# Patient Record
Sex: Male | Born: 2000 | Race: Black or African American | Hispanic: No | Marital: Single | Smoking: Never smoker
Health system: Southern US, Community
[De-identification: ages and names within clinical notes are randomized; demographics above are authoritative.]

## PROBLEM LIST (undated history)

## (undated) DIAGNOSIS — L910 Hypertrophic scar: Secondary | ICD-10-CM

## (undated) HISTORY — PX: BRAIN TUMOR EXCISION: SHX577

## (undated) HISTORY — DX: Hypertrophic scar: L91.0

## (undated) HISTORY — PX: CRANIOTOMY: SHX93

## (undated) HISTORY — PX: CIRCUMCISION: SHX1350

---
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
## 2020-01-19 ENCOUNTER — Other Ambulatory Visit: Payer: Self-pay

## 2020-01-19 ENCOUNTER — Ambulatory Visit (INDEPENDENT_AMBULATORY_CARE_PROVIDER_SITE_OTHER): Payer: 59 | Admitting: Neurology

## 2020-01-19 ENCOUNTER — Encounter (INDEPENDENT_AMBULATORY_CARE_PROVIDER_SITE_OTHER): Payer: Self-pay | Admitting: Neurology

## 2020-01-19 VITALS — BP 118/78 | HR 78 | Ht 68.9 in | Wt 222.4 lb

## 2020-01-19 DIAGNOSIS — G40909 Epilepsy, unspecified, not intractable, without status epilepticus: Secondary | ICD-10-CM | POA: Diagnosis not present

## 2020-01-19 DIAGNOSIS — Z86011 Personal history of benign neoplasm of the brain: Secondary | ICD-10-CM | POA: Diagnosis not present

## 2020-01-19 MED ORDER — LEVETIRACETAM ER 500 MG PO TB24: 1000.0000 mg | ORAL_TABLET | Freq: Every day | ORAL | 6 refills | Status: DC

## 2020-01-19 NOTE — Progress Notes (Signed)
Patient: Dave Jackson MRN: [id number] Sex: male DOB: 06/22/2001  Provider: Teressa Lower, MD Location of Care: [facility]  Note type: Routine return visit  Referral Source: Johny Drilling, MD History from: patient, [facility] chart and mom Chief Complaint: seizures  History of Present Illness: Dave Jackson is a 19 y.o. male is here for follow-up management of seizure disorder.  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 He has been on Keppra with good seizure control and no more clinical seizure activity over the past few years and with the last EEG in January 2021 with normal result. He has been on lower dose of medication since his last visit in January 2021 which is 1000 mg daily, tolerating well with no side effects and no more seizure activity. He has been seen and followed by neurosurgery at [facility] and is going to have a follow-up MRI in September and if that is normal then they will discharge him from their practice. He has no other issues with no headache, no sleep difficulty and no behavioral or mood issues and doing well otherwise.  Review of Systems: Review of system as per HPI, otherwise negative.  Past Medical History:  Diagnosis Date  . Keloid    left parietotemporal area s/p craniotomy    Hospitalizations: No., Head Injury: No., Nervous System Infections: No., Immunizations up to date: Yes.     Surgical History Past Surgical History:  Procedure Laterality Date  . BRAIN TUMOR EXCISION     bx 07/13/02/pontine lesion/s/p 2 resctions consistent w/ pilocystic astyrocytoma  . CIRCUMCISION    . CRANIOTOMY  01/14/03 & 01/14/07   years ago  at [facility]. hospital, benign tumor/plastic reconstruction w/ dd flaps from the scalp  . KELOID EXCISION  2008   removal    Family  History family history includes Cancer in his maternal grandmother; Diabetes in his paternal grandfather; Heart failure in his paternal grandmother.   Social History Social History   Socioeconomic History  . Marital status: Single    Spouse name: Not on file  . Number of children: Not on file  . Years of education: Not on file  . Highest education level: Not on file  Occupational History  . Occupation: Ship broker    Comment: [employer]  Tobacco Use  . Smoking status: Never Smoker  . Smokeless tobacco: Never Used  Substance and Sexual Activity  . Alcohol use: No  . Drug use: No  . Sexual activity: Never  Other Topics Concern  . Not on file  Social History Narrative   [name] attends [employer]. He does well in school. He enjoys playing basketball.    Lives with is parents and younger brother.   Social Determinants of Health   Financial Resource Strain:   . Difficulty of Paying Living Expenses:   Food Insecurity:   . Worried About Charity fundraiser in the Last Year:   . Arboriculturist in the Last Year:   Transportation Needs:   . Film/video editor (Medical):   Marland Kitchen Lack of Transportation (Non-Medical):   Physical Activity:   . Days of Exercise per Week:   . Minutes of Exercise per Session:   Stress:   . Feeling of Stress :   Social Connections:   . Frequency of Communication with Friends  and Family:   . Frequency of Social Gatherings with Friends and Family:   . Attends Religious Services:   . Active Member of Clubs or Organizations:   . Attends Archivist Meetings:   Marland Kitchen Marital Status:      No Known Allergies  Physical Exam BP 118/78   Pulse 78   Ht 5' 8.9" (1.75 m)   Wt 222 lb 7.1 oz (100.9 kg)   BMI 32.95 kg/m  Gen: Awake, alert, not in distress Skin: No rash, No neurocutaneous stigmata. HEENT: Normocephalic, no dysmorphic features, no conjunctival injection, nares patent, mucous membranes moist, oropharynx clear. Neck:  Supple, no meningismus. No focal tenderness. Resp: Clear to auscultation bilaterally CV: Regular rate, normal S1/S2, no murmurs, no rubs Abd: BS present, abdomen soft, non-tender, non-distended. No hepatosplenomegaly or mass Ext: Warm and well-perfused. No deformities, no muscle wasting, ROM full.  Neurological Examination: MS: Awake, alert, interactive. Normal eye contact, answered the questions appropriately, speech was fluent,  Normal comprehension.  Attention and concentration were normal. Cranial Nerves: Pupils were equal and reactive to light ( 5-76mm);  normal fundoscopic exam with sharp discs, visual field full with confrontation test; EOM normal, no nystagmus; no ptsosis, no double vision, intact facial sensation, face symmetric with full strength of facial muscles, hearing intact to finger rub bilaterally, palate elevation is symmetric, tongue protrusion is symmetric with full movement to both sides.  Sternocleidomastoid and trapezius are with normal strength. Tone-Normal Strength-Normal strength in all muscle groups DTRs-  Biceps Triceps Brachioradialis Patellar Ankle  R 2+ 2+ 2+ 2+ 2+  L 2+ 2+ 2+ 2+ 2+   Plantar responses flexor bilaterally, no clonus noted Sensation: Intact to light touch,  Romberg negative. Coordination: No dysmetria on FTN test. No difficulty with balance. Gait: Normal walk and run. Tandem gait was normal. Was able to perform toe walking and heel walking without difficulty.   Assessment and Plan 1. Seizure disorder ([place])   2. Personal history of benign brain tumor    This is an 19 year old male with diagnosis of pilocytic astrocytoma in pontine area in 2004 and seizure disorder since 2016, currently on fairly low-dose of Keppra at 1000 mg daily with no seizure activity for the past few years.  His last EEG was normal. Recommendations: Continue the same dose of Keppra at 1000 mg daily for now He will have the follow-up brain MRI in September Mother will  call after the MRI and if the result is normal then I will taper the Keppra to 500 mg for 4 to 6 weeks and then stop the medication and right after that I will perform a sleep deprived EEG which would be toward the end of October to evaluate for any epileptiform discharges off of medication. If his EEG is normal then she would be discharged from our practice but if there is any abnormality then we will restart him on seizure medication.  He and his mother understood and agreed with the plan.  Meds ordered this encounter  Medications  . levETIRAcetam (KEPPRA XR) 500 MG 24 hr tablet    Sig: Take 2 tablets (1,000 mg total) by mouth daily.    Dispense:  60 tablet    Refill:  6   Orders Placed This Encounter  Procedures  . Child sleep deprived EEG    Standing Status:   Future    Standing Expiration Date:   01/18/2021

## 2020-01-19 NOTE — Patient Instructions (Addendum)
Continue Keppra at the same dose of 1000 mg twice daily After the brain MRI in September, call the office to discuss decreasing the dose of Keppra if the MRI is normal We will schedule for a follow-up EEG at the end of October. I will see you at the same time with the EEG.

## 2020-03-24 NOTE — Progress Notes (Signed)
error 

## 2020-04-21 ENCOUNTER — Ambulatory Visit (INDEPENDENT_AMBULATORY_CARE_PROVIDER_SITE_OTHER): Payer: 59 | Admitting: Neurology

## 2020-04-28 ENCOUNTER — Ambulatory Visit (INDEPENDENT_AMBULATORY_CARE_PROVIDER_SITE_OTHER): Payer: 59 | Admitting: Neurology

## 2020-04-28 ENCOUNTER — Other Ambulatory Visit: Payer: Self-pay

## 2020-04-28 ENCOUNTER — Encounter (INDEPENDENT_AMBULATORY_CARE_PROVIDER_SITE_OTHER): Payer: Self-pay | Admitting: Neurology

## 2020-04-28 VITALS — BP 118/78 | HR 80 | Ht 69.09 in | Wt 223.5 lb

## 2020-04-28 DIAGNOSIS — G40909 Epilepsy, unspecified, not intractable, without status epilepticus: Secondary | ICD-10-CM

## 2020-04-28 DIAGNOSIS — R569 Unspecified convulsions: Secondary | ICD-10-CM

## 2020-04-28 DIAGNOSIS — Z86011 Personal history of benign neoplasm of the brain: Secondary | ICD-10-CM | POA: Diagnosis not present

## 2020-04-28 MED ORDER — LEVETIRACETAM ER 500 MG PO TB24: 500.0000 mg | ORAL_TABLET | Freq: Every day | ORAL | 1 refills | Status: DC

## 2020-04-28 NOTE — Progress Notes (Signed)
Patient: Dave Jackson MRN: [id number] Sex: male DOB: [birth year]  Provider: Teressa Lower, MD Location of Care: [facility]  Note type: Routine return visit  Referral Source: Johny Drilling, MD History from: patient, [facility] chart and mom Chief Complaint: Seizure, EEG Results  History of Present Illness: Dave Jackson is a 19 y.o. male is here for follow-up management of seizure disorder and discussing the EEG result.  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 He was last seen in July 2021 and he was recommended to continue with lower dose of Keppra at 1000 mg daily until he would have his follow-up MRI at [facility] and then depends on the results, we will do another EEG and then decide regarding discontinuing seizure medication. His MRI was done last month in September with stable findings compared to the previous MRI so he was released from neurosurgery care without any more follow-up visit. As mentioned over the past several months he has not had any other issues and has been doing well with no more seizure activity and without any balance issues or visual changes. He underwent an EEG prior to this visit which did not show any epileptiform discharges or seizure activity.  Review of Systems: Review of system as per HPI, otherwise negative.  Past Medical History:  Diagnosis Date  . Keloid    left parietotemporal area s/p craniotomy    Hospitalizations: No., Head Injury: No., Nervous System Infections: No., Immunizations up to date: Yes.     Surgical History Past Surgical History:  Procedure  Laterality Date  . BRAIN TUMOR EXCISION     bx 07/13/02/pontine lesion/s/p 2 resctions consistent w/ pilocystic astyrocytoma  . CIRCUMCISION    . CRANIOTOMY  01/14/03 & 01/14/07   years ago  at [facility]. hospital, benign tumor/plastic reconstruction w/ dd flaps from the scalp  . KELOID EXCISION  2008   removal    Family History family history includes Cancer in his maternal grandmother; Diabetes in his paternal grandfather; Heart failure in his paternal grandmother.   Social History Social History   Socioeconomic History  . Marital status: Single    Spouse name: Not on file  . Number of children: Not on file  . Years of education: Not on file  . Highest education level: Not on file  Occupational History  . Occupation: Ship broker    Comment: [employer]  Tobacco Use  . Smoking status: Never Smoker  . Smokeless tobacco: Never Used  Substance and Sexual Activity  . Alcohol use: No  . Drug use: No  . Sexual activity: Never  Other Topics Concern  . Not on file  Social History Narrative   Dave Jackson attends [employer]. He does well in school. He enjoys playing basketball.    Lives with is parents and younger brother.   Social Determinants of Health   Financial Resource Strain:   . Difficulty of Paying Living Expenses: Not on file  Food Insecurity:   . Worried About Charity fundraiser in the Last Year: Not on file  . Ran Out of Food in the Last  Year: Not on file  Transportation Needs:   . Lack of Transportation (Medical): Not on file  . Lack of Transportation (Non-Medical): Not on file  Physical Activity:   . Days of Exercise per Week: Not on file  . Minutes of Exercise per Session: Not on file  Stress:   . Feeling of Stress : Not on file  Social Connections:   . Frequency of Communication with Friends and Family: Not on file  . Frequency of Social Gatherings with Friends and Family: Not on file  . Attends Religious Services: Not on file  . Active Member of  Clubs or Organizations: Not on file  . Attends Archivist Meetings: Not on file  . Marital Status: Not on file     No Known Allergies  Physical Exam BP 118/78   Pulse 80   Ht 5' 9.09" (1.755 m)   Wt 223 lb 8.7 oz (101.4 kg)   BMI 32.92 kg/m  Gen: Awake, alert, not in distress Skin: No rash, No neurocutaneous stigmata. HEENT: Normocephalic, no dysmorphic features, no conjunctival injection, nares patent, mucous membranes moist, oropharynx clear. Neck: Supple, no meningismus. No focal tenderness. Resp: Clear to auscultation bilaterally CV: Regular rate, normal S1/S2, no murmurs, no rubs Abd: BS present, abdomen soft, non-tender, non-distended. No hepatosplenomegaly or mass Ext: Warm and well-perfused. No deformities, no muscle wasting, ROM full.  Neurological Examination: MS: Awake, alert, interactive. Normal eye contact, answered the questions appropriately, speech was fluent,  Normal comprehension.  Attention and concentration were normal. Cranial Nerves: Pupils were equal and reactive to light ( 5-83mm);  normal fundoscopic exam with sharp discs, visual field full with confrontation test; EOM normal, no nystagmus; no ptsosis, no double vision, intact facial sensation, face symmetric with full strength of facial muscles, hearing intact to finger rub bilaterally, palate elevation is symmetric, tongue protrusion is symmetric with full movement to both sides.  Sternocleidomastoid and trapezius are with normal strength. Tone-Normal Strength-Normal strength in all muscle groups DTRs-  Biceps Triceps Brachioradialis Patellar Ankle  R 2+ 2+ 2+ 2+ 2+  L 2+ 2+ 2+ 2+ 2+   Plantar responses flexor bilaterally, no clonus noted Sensation: Intact to light touch,  Romberg negative. Coordination: No dysmetria on FTN test. No difficulty with balance. Gait: Normal walk and run. Tandem gait was normal. Was able to perform toe walking and heel walking without difficulty.   Assessment and  Plan 1. Seizure disorder ([place])   2. Personal history of benign brain tumor    This is a 53 and half-year-old male with history of pilocytic astrocytoma in 2004 status post repair and seizure since 2016 on Keppra with no more seizure activity.  His EEG prior to this visit was normal as well as the previous EEG in January 2021 and he has not had any clinical seizure activity for the past few years.  His follow-up brain MRI last month was stable. Discussed with patient and his mother that since he has been doing well without any clinical seizure activity and with 2 normal EEGs in 2021, I think he could taper and discontinue seizure medication without any issues although there is always a slight increase chance of seizure activity during tapering of medication. I would recommend to decrease the dose of Keppra to 500 mg nightly for the next 2 months and then discontinue medication. If he develops any clinical episodes concerning for seizure activity, he will call my office and let me know. He will continue with adequate sleep and  limited screen time to prevent from more seizure activity I think he may benefit from follow-up with an ophthalmology for regular eye exam and also check with funduscopy and peripheral vision. He will continue follow-up with his primary care physician but I will be available for any question concerns.  He and his mother understood and agreed with the plan.  Meds ordered this encounter  Medications  . levETIRAcetam (KEPPRA XR) 500 MG 24 hr tablet    Sig: Take 1 tablet (500 mg total) by mouth at bedtime.    Dispense:  30 tablet    Refill:  1

## 2020-04-28 NOTE — Procedures (Signed)
Patient:  Dave Jackson   Sex: male  DOB:  2000/09/06  Date of study: 04/28/2020                  Clinical history: This is a 19 year old male with remote history of pilocytic astrocytoma status post surgery, history of seizure disorder, currently on medication.  This is a follow-up EEG for evaluation of epileptiform discharges.  Medication: Keppra             Procedure: The tracing was carried out on a 32 channel digital Cadwell recorder reformatted into 16 channel montages with 1 devoted to EKG.  The 10 /20 international system electrode placement was used. Recording was done during awake state. Recording time 34 minutes.   Description of findings: Background rhythm consists of amplitude of   30 microvolt and frequency of 9-10 hertz posterior dominant rhythm. There was normal anterior posterior gradient noted. Background was well organized, continuous and symmetric with no focal slowing. There were occasional muscle artifact as well has pulse artifacts noted. Hyperventilation resulted in slowing of the background activity. Photic stimulation using stepwise increase in photic frequency resulted in bilateral symmetric driving response. Throughout the recording there were no focal or generalized epileptiform activities in the form of spikes or sharps noted. There were no transient rhythmic activities or electrographic seizures noted. One lead EKG rhythm strip revealed sinus rhythm at a rate of 75 bpm.  Impression: This EEG is normal during awake state. Please note that normal EEG does not exclude epilepsy, clinical correlation is indicated.      Teressa Lower, MD

## 2020-04-28 NOTE — Progress Notes (Signed)
EEG completed, results pending. 

## 2020-04-28 NOTE — Patient Instructions (Signed)
Your EEG is normal Your brain MRI is stable as well Please decrease the dose of Keppra to 500 mg every night for the next 2 months Then stop the medication if no more seizure activity If there is any concern, call my office and let me know Otherwise continue follow-up with your primary care physician and I will be available for any question concerns

## 2021-04-18 ENCOUNTER — Encounter: Payer: Self-pay | Admitting: Neurology

## 2021-04-18 ENCOUNTER — Ambulatory Visit (INDEPENDENT_AMBULATORY_CARE_PROVIDER_SITE_OTHER): Payer: 59 | Admitting: Neurology

## 2021-04-18 VITALS — BP 132/76 | HR 68 | Ht 69.0 in | Wt 240.0 lb

## 2021-04-18 DIAGNOSIS — Z87898 Personal history of other specified conditions: Secondary | ICD-10-CM

## 2021-04-18 DIAGNOSIS — G44209 Tension-type headache, unspecified, not intractable: Secondary | ICD-10-CM | POA: Diagnosis not present

## 2021-04-18 NOTE — Patient Instructions (Signed)
Continue with Topamax for 2 months Return in 1 year or sooner if worse

## 2021-04-18 NOTE — Progress Notes (Signed)
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  In terms of his pilocytic astrocytoma patient had surveillance MRIs which were all stable and he was recently discharged from the neurosurgery clinic.  When it comes to his seizures, it was well controlled on Keppra monotherapy, and he has been seizure-free for more than a few years.  In October 2021 his pediatric neurologist has tapered him and discontinued the [place].  He has not had any seizures since.  He presented today with complaint of headaches.  Michela Pitcher that he has been having intermittent headache for the past 2 years but in the past 37-months headaches have worsened.  He described the headache as pressure pain in the back of his head and they can last the whole day, sometimes, he goes to bed with the headaches.  He has tried ibuprofen and Tylenol without much relief.  Denies any nausea, denies vomiting, no sensitivity to light or noise.  He did follow-up with his primary care doctor who started on topiramate a month ago.  Patient reported since starting the topiramate he has not had any additional headaches, the headaches resolved.  No other complaints.   Headache History and Characteristics: Onset: 2 years ago but worse in the past  50-month Location: Back of the head Quality: Pressure Intensity: 4-5 /10.  Duration: Migrainous Features: None.  Aura: No  History of brain injury or tumor: Yes, history of pilocytic as astrocytoma resected in 2004.  Family history: No Motion sickness: no Cardiac history: no  OTC: tylenol, ibuprofen   Prior prophylaxis: Propranolol: No  Verapamil:No TCA: No Topamax: yes Depakote: No Effexor: No Cymbalta: No Neurontin:No  Prior abortives: Triptan: No Anti-emetic: No Steroids: No Ergotamine suppository: No    OTHER MEDICAL CONDITIONS: Pilocytic astrocytoma resected in 2004, seizures, headaches   REVIEW OF SYSTEMS: Full 14 system review of systems performed and negative with exception of: As noted in the HPI.   ALLERGIES: No Known Allergies  HOME MEDICATIONS: Outpatient Medications Prior to Visit  Medication Sig Dispense Refill   topiramate (TOPAMAX) 25 MG tablet Take 25 mg by mouth daily.     levETIRAcetam (KEPPRA XR) 500 MG 24 hr tablet Take 1 tablet (500 mg total) by mouth at bedtime. 30 tablet 1   No facility-administered medications prior to visit.    PAST MEDICAL HISTORY: Past Medical History:  Diagnosis Date   Keloid    left parietotemporal area s/p craniotomy     PAST SURGICAL HISTORY: Past Surgical History:  Procedure Laterality Date   BRAIN TUMOR EXCISION     bx 07/13/02/pontine lesion/s/p 2 resctions consistent w/ pilocystic astyrocytoma   CIRCUMCISION     CRANIOTOMY  01/14/03 & 01/14/07   years ago  at [facility]. hospital, benign tumor/plastic reconstruction w/ dd flaps from the scalp  KELOID EXCISION  2008   removal    FAMILY HISTORY: Family History  Problem Relation Age of Onset   Cancer Maternal Grandmother        ovarian   Heart failure Paternal Grandmother    Diabetes Paternal Grandfather     SOCIAL HISTORY: Social History   Socioeconomic History   Marital status: Single    Spouse name: Not on file   Number of children:  Not on file   Years of education: Not on file   Highest education level: Not on file  Occupational History   Occupation: student    Comment: [employer]  Tobacco Use   Smoking status: Never   Smokeless tobacco: Never  Substance and Sexual Activity   Alcohol use: No   Drug use: No   Sexual activity: Never  Other Topics Concern   Not on file  Social History Narrative   Dave Jackson attends [employer]. He does well in school. He enjoys playing basketball.    Lives with is parents and younger brother.   Social Determinants of Health   Financial Resource Strain: Not on file  Food Insecurity: Not on file  Transportation Needs: Not on file  Physical Activity: Not on file  Stress: Not on file  Social Connections: Not on file  Intimate Partner Violence: Not on file     PHYSICAL EXAM  GENERAL EXAM/CONSTITUTIONAL: Vitals:  Vitals:   04/18/21 1434  BP: 132/76  Pulse: 68  Weight: 240 lb (108.9 kg)  Height: 5\' 9"  (1.753 m)   Body mass index is 35.44 kg/m. Wt Readings from Last 3 Encounters:  04/18/21 240 lb (108.9 kg)  04/28/20 223 lb 8.7 oz (101.4 kg) (97 %, Z= 1.94)*  01/19/20 222 lb 7.1 oz (100.9 kg) (97 %, Z= 1.93)*   * Growth percentiles are based on CDC (Boys, 2-20 Years) data.   Patient is in no distress; well developed, nourished and groomed; neck is supple  EYES: Pupils round and reactive to light, Visual fields full to confrontation, Extraocular movements intacts,   MUSCULOSKELETAL: Gait, strength, tone, movements noted in Neurologic exam below  NEUROLOGIC: MENTAL STATUS:  No flowsheet data found. awake, alert, oriented to person, place and time recent and remote memory intact normal attention and concentration language fluent, comprehension intact, naming intact fund of knowledge appropriate  CRANIAL NERVE:  2nd, 3rd, 4th, 6th - pupils equal and reactive to light, visual fields full to confrontation, extraocular muscles intact, no  nystagmus 5th - facial sensation symmetric 7th - facial strength symmetric 8th - hearing intact 9th - palate elevates symmetrically, uvula midline 11th - shoulder shrug symmetric 12th - tongue protrusion midline  MOTOR:  normal bulk and tone, full strength in the BUE, BLE  SENSORY:  normal and symmetric to light touch, pinprick, temperature, vibration  COORDINATION:  finger-nose-finger, fine finger movements normal  REFLEXES:  deep tendon reflexes present and symmetric  GAIT/STATION:  normal    DIAGNOSTIC DATA (LABS, IMAGING, TESTING) - I reviewed patient records, labs, notes, testing and imaging myself where available.  Lab Results  Component Value Date   WBC 20.8 (H) 03/27/2015   HGB 14.2 03/27/2015   HCT 40.6 03/27/2015   MCV 76.5 (L) 03/27/2015   PLT 321 03/27/2015      Component Value Date/Time   NA 136 03/27/2015 1345   K 3.4 (L) 03/27/2015 1345   CL 102 03/27/2015 1345   CO2 25 03/27/2015 1345   GLUCOSE 134 (H) 03/27/2015 1345  BUN 9 03/27/2015 1345   CREATININE 0.78 03/27/2015 1345   CALCIUM 9.2 03/27/2015 1345   PROT 6.9 03/27/2015 1345   ALBUMIN 4.1 03/27/2015 1345   AST 42 (H) 03/27/2015 1345   ALT 38 03/27/2015 1345   ALKPHOS 123 03/27/2015 1345   BILITOT 0.6 03/27/2015 1345   GFRNONAA NOT CALCULATED 03/27/2015 1345   GFRAA NOT CALCULATED 03/27/2015 1345   No results found for: CHOL, HDL, LDLCALC, LDLDIRECT, TRIG, CHOLHDL No results found for: HGBA1C No results found for: VITAMINB12 No results found for: TSH  Head CT 2016 No acute intracranial findings. Postsurgical change compatible with prior brain stem tumor excision with subtle heterogeneous density at the surgical site. Consider further evaluation with MRI with contrast in this patient with history of previous brainstem tumor excision and new onset seizure.    ASSESSMENT AND PLAN  20 y.o. year old male with past medical history of pilocytic astrocytoma resected in 2004, seizure  disorder, seizure-free for the past few years not on any antiseizure medication who is presenting with headaches described as pressure type pain in the back of his head was in the past 50-month but resolve after starting Topamax.  Patient likely had tension type headaches.  Advised him to continue with Topamax from total of 56-months then discontinue medication if he does not have any additional headaches.  I will see him in 1 year for follow-up, also advised the patient to call me for any new problem or concern.   1. Tension headache   2. History of brain tumor   3. History of seizure     PLAN: Continue with Topamax for 2 months Return in 1 year or sooner if worse  No orders of the defined types were placed in this encounter.   No orders of the defined types were placed in this encounter.   Return in about 1 year (around 04/18/2022).    Alric Ran, MD 04/18/2021, 4:08 PM  [facility] [address] [place], [place] [address] [phone]

## 2022-04-18 ENCOUNTER — Ambulatory Visit: Payer: 59 | Admitting: Neurology

## 2022-08-27 DIAGNOSIS — L738 Other specified follicular disorders: Secondary | ICD-10-CM | POA: Diagnosis not present

## 2022-08-27 DIAGNOSIS — R030 Elevated blood-pressure reading, without diagnosis of hypertension: Secondary | ICD-10-CM | POA: Diagnosis not present

## 2022-09-26 ENCOUNTER — Telehealth (INDEPENDENT_AMBULATORY_CARE_PROVIDER_SITE_OTHER): Payer: Self-pay | Admitting: Neurology

## 2022-09-26 ENCOUNTER — Encounter (INDEPENDENT_AMBULATORY_CARE_PROVIDER_SITE_OTHER): Payer: Self-pay

## 2022-09-26 DIAGNOSIS — Z719 Counseling, unspecified: Secondary | ICD-10-CM | POA: Insufficient documentation

## 2022-09-26 DIAGNOSIS — H539 Unspecified visual disturbance: Secondary | ICD-10-CM | POA: Insufficient documentation

## 2022-09-26 DIAGNOSIS — Z00129 Encounter for routine child health examination without abnormal findings: Secondary | ICD-10-CM | POA: Insufficient documentation

## 2022-09-26 DIAGNOSIS — R059 Cough, unspecified: Secondary | ICD-10-CM | POA: Insufficient documentation

## 2022-09-26 DIAGNOSIS — Z862 Personal history of diseases of the blood and blood-forming organs and certain disorders involving the immune mechanism: Secondary | ICD-10-CM | POA: Insufficient documentation

## 2022-09-26 DIAGNOSIS — J40 Bronchitis, not specified as acute or chronic: Secondary | ICD-10-CM | POA: Insufficient documentation

## 2022-09-26 DIAGNOSIS — R32 Unspecified urinary incontinence: Secondary | ICD-10-CM | POA: Insufficient documentation

## 2022-09-26 DIAGNOSIS — B081 Molluscum contagiosum: Secondary | ICD-10-CM | POA: Insufficient documentation

## 2022-09-26 DIAGNOSIS — N6459 Other signs and symptoms in breast: Secondary | ICD-10-CM | POA: Insufficient documentation

## 2022-09-26 DIAGNOSIS — L309 Dermatitis, unspecified: Secondary | ICD-10-CM | POA: Insufficient documentation

## 2022-09-26 DIAGNOSIS — J069 Acute upper respiratory infection, unspecified: Secondary | ICD-10-CM | POA: Insufficient documentation

## 2022-09-26 DIAGNOSIS — Z713 Dietary counseling and surveillance: Secondary | ICD-10-CM | POA: Insufficient documentation

## 2022-09-26 DIAGNOSIS — Z7189 Other specified counseling: Secondary | ICD-10-CM | POA: Insufficient documentation

## 2022-09-26 DIAGNOSIS — H547 Unspecified visual loss: Secondary | ICD-10-CM | POA: Insufficient documentation

## 2022-09-26 DIAGNOSIS — D496 Neoplasm of unspecified behavior of brain: Secondary | ICD-10-CM | POA: Insufficient documentation

## 2022-09-26 DIAGNOSIS — Z23 Encounter for immunization: Secondary | ICD-10-CM | POA: Insufficient documentation

## 2022-09-26 DIAGNOSIS — R638 Other symptoms and signs concerning food and fluid intake: Secondary | ICD-10-CM | POA: Insufficient documentation

## 2022-09-26 NOTE — Telephone Encounter (Signed)
  Name of who is calling: Dave Jackson  Caller's Relationship to Patient: self  Best contact number: [phone]  Provider they see: Dr. Secundino Ginger  Reason for call: Patient called to schedule appt but since he is already 22 wanted to see if he can still be scheduled or will he need to see an adult neurologist?     [place]  Name of prescription:  Pharmacy:

## 2022-09-26 NOTE — Telephone Encounter (Signed)
Called patient back.  Confirmed no recent seizure activity, medication use or refill for previous seizures. Patient released (medically) re: seizures and previous medication therapy due to no activity.  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  I discussed with Dr. Jordan Hawks and we will complete that part of the form (to acknowledge release of this part of his medical history and previous* medication use).  I have printed these and will provide to Dr. Jordan Hawks for completion and/or letter attachment and call patient to p/u after.  B. Roten CMA

## 2023-04-09 DIAGNOSIS — Z87898 Personal history of other specified conditions: Secondary | ICD-10-CM | POA: Diagnosis not present

## 2023-04-09 DIAGNOSIS — R030 Elevated blood-pressure reading, without diagnosis of hypertension: Secondary | ICD-10-CM | POA: Diagnosis not present

## 2023-04-09 DIAGNOSIS — Z8669 Personal history of other diseases of the nervous system and sense organs: Secondary | ICD-10-CM | POA: Diagnosis not present

## 2023-04-09 DIAGNOSIS — Z0000 Encounter for general adult medical examination without abnormal findings: Secondary | ICD-10-CM | POA: Diagnosis not present

## 2023-04-09 DIAGNOSIS — Z13220 Encounter for screening for lipoid disorders: Secondary | ICD-10-CM | POA: Diagnosis not present

## 2023-04-09 DIAGNOSIS — Z23 Encounter for immunization: Secondary | ICD-10-CM | POA: Diagnosis not present

## 2023-04-09 DIAGNOSIS — E663 Overweight: Secondary | ICD-10-CM | POA: Diagnosis not present
